# Patient Record
Sex: Male | Born: 1990 | Race: Black or African American | Hispanic: No | Marital: Married | Smoking: Former smoker
Health system: Southern US, Community
[De-identification: ages and names within clinical notes are randomized; demographics above are authoritative.]

## PROBLEM LIST (undated history)

## (undated) DIAGNOSIS — Z91018 Allergy to other foods: Secondary | ICD-10-CM

## (undated) DIAGNOSIS — J309 Allergic rhinitis, unspecified: Secondary | ICD-10-CM

## (undated) DIAGNOSIS — J45909 Unspecified asthma, uncomplicated: Secondary | ICD-10-CM

## (undated) HISTORY — DX: Unspecified asthma, uncomplicated: J45.909

## (undated) HISTORY — DX: Allergy to other foods: Z91.018

## (undated) HISTORY — DX: Allergic rhinitis, unspecified: J30.9

---

## 2015-09-17 ENCOUNTER — Encounter: Payer: Self-pay | Admitting: Pediatrics

## 2015-09-17 ENCOUNTER — Ambulatory Visit (INDEPENDENT_AMBULATORY_CARE_PROVIDER_SITE_OTHER): Payer: TRICARE For Life (TFL) | Admitting: Pediatrics

## 2015-09-17 VITALS — BP 108/76 | HR 70 | Temp 98.2°F | Resp 16 | Ht 70.0 in | Wt 155.6 lb

## 2015-09-17 DIAGNOSIS — J301 Allergic rhinitis due to pollen: Secondary | ICD-10-CM | POA: Diagnosis not present

## 2015-09-17 DIAGNOSIS — T7800XA Anaphylactic reaction due to unspecified food, initial encounter: Secondary | ICD-10-CM | POA: Insufficient documentation

## 2015-09-17 DIAGNOSIS — J4550 Severe persistent asthma, uncomplicated: Secondary | ICD-10-CM | POA: Diagnosis not present

## 2015-09-17 MED ORDER — EPINEPHRINE 0.3 MG/0.3ML IJ SOAJ: INTRAMUSCULAR | Status: AC

## 2015-09-17 MED ORDER — FLUTICASONE PROPIONATE 50 MCG/ACT NA SUSP: 2.0000 | Freq: Every day | NASAL | Status: AC

## 2015-09-17 MED ORDER — ALBUTEROL SULFATE HFA 108 (90 BASE) MCG/ACT IN AERS: 2.0000 | INHALATION_SPRAY | RESPIRATORY_TRACT | Status: AC | PRN

## 2015-09-17 MED ORDER — MOMETASONE FURO-FORMOTEROL FUM 200-5 MCG/ACT IN AERO: 2.0000 | INHALATION_SPRAY | Freq: Two times a day (BID) | RESPIRATORY_TRACT | Status: AC

## 2015-09-17 MED ORDER — MONTELUKAST SODIUM 10 MG PO TABS: 10.0000 mg | ORAL_TABLET | Freq: Every day | ORAL | Status: AC

## 2015-09-17 MED ORDER — CETIRIZINE HCL 10 MG PO TABS: 10.0000 mg | ORAL_TABLET | Freq: Every day | ORAL | Status: AC

## 2015-09-17 NOTE — Patient Instructions (Addendum)
Environmental control of dust mite and mold Zyrtec 10 mg once a day for runny nose or itchy eyes Fluticasone 2 sprays per nostril once a day for stuffy nose, if needed Opcon-A one drop 3 times a day if needed for itchy eyes Dulera 200-2 puffs every 12 hours for coughing or wheezing instead of Symbicort Montelukast  10 mg once a day for coughing or wheezing Pro-air 2 puffs every 4 hours if needed for wheezing or coughing spells Add prednisone 20 mg twice a day for 3 days, 20 mg on the fourth day, 10 mg on the fifth day to bring your asthma under control  Avoid tree nuts, corn and yeast. If you have an allergic reaction take Benadryl 50 mg every 4 hours and if you have life-threatening symptoms inject  with EpiPen 0.3 mg  Call me if you're not doing well on this treatment plan

## 2015-09-17 NOTE — Progress Notes (Signed)
[address] [place] [address] Dept: [phone]  New Patient Note  Patient ID: Kenneth Warner, male    DOB: [birth year]  Age: 25 y.o. MRN: [id number] Date of Office Visit: 09/17/2015 Referring provider: No referring provider defined for this encounter.    Chief Complaint: Asthma and Food Intolerance  HPI Kenneth Warner presents forFor evaluation of asthma for 5 years. He has been using his Pro-air inhaler twice a day for several years. He has had a runny nose and a stuffy nose for several years also. He has aggravation of his symptoms on exposure to dust, cigarette smoke and the spring and early summer. When he has eaten tree nuts, corn or yeast bread , he has had swelling of his face and swelling of his lips.. He does not have a history of eczema he has been on Symbicort 160-2 puffs every 12 hours for several years.. The last time he had an insect sting , he did not have any local swelling or anaphylactic symptoms. He took Benadryl and felt drowsy.  Review of Systems  Constitutional: Negative.   HENT:       Perennial nasal congestion for several years but worse in the springtime  Eyes: Negative.   Respiratory:       Asthma for 5 years  Cardiovascular: Negative.   Gastrointestinal: Negative.   Genitourinary: Negative.   Musculoskeletal: Negative.   Skin: Negative.   Neurological: Negative.   Endo/Heme/Allergies:       No diabetes or thyroid disease. Allergies to tree nuts, corn and yeast  Psychiatric/Behavioral: Negative.     Outpatient Encounter Prescriptions as of 09/17/2015  Medication Sig  . albuterol (PROAIR HFA) 108 (90 Base) MCG/ACT inhaler Inhale 2 puffs into the lungs every 4 (four) hours as needed for wheezing or shortness of breath.  . budesonide-formoterol (SYMBICORT) 160-4.5 MCG/ACT inhaler Inhale into the lungs 2 (two) times daily.  . cetirizine (ZYRTEC) 10 MG tablet Take 1 tablet (10 mg total) by mouth daily.  Marland Kitchen desloratadine (CLARINEX) 5 MG tablet Take  5 mg by mouth daily.  Marland Kitchen EPINEPHrine (EPIPEN 2-PAK) 0.3 mg/0.3 mL IJ SOAJ injection Use as directed for severe allergic reactions.  . fluticasone (FLONASE) 50 MCG/ACT nasal spray Place 2 sprays into both nostrils daily.  . mometasone-formoterol (DULERA) 200-5 MCG/ACT AERO Inhale 2 puffs into the lungs 2 (two) times daily.  . montelukast (SINGULAIR) 10 MG tablet Take 1 tablet (10 mg total) by mouth at bedtime.  . [DISCONTINUED] albuterol (PROAIR HFA) 108 (90 Base) MCG/ACT inhaler Inhale 2 puffs into the lungs every 4 (four) hours as needed for wheezing or shortness of breath.   No facility-administered encounter medications on file as of 09/17/2015.     Drug Allergies:  No Known Allergies  Family History: Jeury's family history includes Asthma in his mother; Eczema in his sister. There is no history of Allergic rhinitis, Angioedema, Immunodeficiency, or Urticaria..  Social and environmental. There is a dog outside the home. He is not exposed to cigarette smoking. He only smoked cigarettes for one half years he does not smoke cigarettes currently. He works indoors. His symptoms are not worse at work.  Physical Exam: BP 108/76 mmHg  Pulse 70  Temp(Src) 98.2 F (36.8 C) (Oral)  Resp 16  Ht  (1.778 m)  Wt 155 lb 9.6 oz (70.58 kg)  BMI 22.33 kg/m2  SpO2 98%   Physical Exam  Constitutional: He is oriented to person, place, and time. He appears well-developed and well-nourished.  HENT:  Eyes normal. Ears normal. Nose moderate swelling of nasal turbinates with clear nasal discharge. Pharynx normal.  Neck: Neck supple. No thyromegaly present.  Cardiovascular:  S1 and S2 normal no murmurs  Pulmonary/Chest:  Clear to percussion and auscultation  Abdominal: Soft. There is no tenderness (no hepatosplenomegaly).  Lymphadenopathy:    He has no cervical adenopathy.  Neurological: He is alert and oriented to person, place, and time.  Skin:  Clear  Psychiatric: He has a normal mood and  affect. His behavior is normal. Judgment and thought content normal.  Vitals reviewed.   Diagnostics: FVC 4.58 L FEV1 3.56 L. Predicted FVC 4.65 L predicted 13.92 liters. After albuterol 2 puffs FVC 4.92 L FEV1 4.08 L-this shows a mild reduction in the FEV1 percent with significant improvement in the FEV1 after albuterol  Allergy skin tests were positive to grass pollens, weeds, tree pollens, molds, dust mite, cat and cockroach. He also had positive skin test to corn, pecan, walnut, almond, hazelnut and yeast.   Assessment Assessment and Plan: 1. Severe persistent asthma, uncomplicated   2. Allergic rhinitis due to pollen   3. Allergy with anaphylaxis due to food, initial encounter     Meds ordered this encounter  Medications  . mometasone-formoterol (DULERA) 200-5 MCG/ACT AERO    Sig: Inhale 2 puffs into the lungs 2 (two) times daily.    Dispense:  1 Inhaler    Refill:  5    To prevent cough or wheeze.  Marland Kitchen. albuterol (PROAIR HFA) 108 (90 Base) MCG/ACT inhaler    Sig: Inhale 2 puffs into the lungs every 4 (four) hours as needed for wheezing or shortness of breath.    Dispense:  1 Inhaler    Refill:  1  . fluticasone (FLONASE) 50 MCG/ACT nasal spray    Sig: Place 2 sprays into both nostrils daily.    Dispense:  16 g    Refill:  5    For stuffy nose.  . montelukast (SINGULAIR) 10 MG tablet    Sig: Take 1 tablet (10 mg total) by mouth at bedtime.    Dispense:  30 tablet    Refill:  5    For cough or wheeze.  Marland Kitchen. EPINEPHrine (EPIPEN 2-PAK) 0.3 mg/0.3 mL IJ SOAJ injection    Sig: Use as directed for severe allergic reactions.    Dispense:  2 Device    Refill:  1    Dispense mylan brand only.  . cetirizine (ZYRTEC) 10 MG tablet    Sig: Take 1 tablet (10 mg total) by mouth daily.    Dispense:  34 tablet    Refill:  5    For runny nose or itching.    Patient Instructions  Environmental control of dust mite and mold Zyrtec 10 mg once a day for runny nose or itchy  eyes Fluticasone 2 sprays per nostril once a day for stuffy nose Opcon-A one drop 3 times a day if needed for itchy eyes Dulera 200-2 puffs every 12 hours for coughing or wheezing instead of Symbicort Montelukast  10 mg once a day for coughing or wheezing Pro-air 2 puffs every 4 hours if needed for wheezing or coughing spells Add prednisone 20 mg twice a day for 3 days, 20 mg on the fourth day, 10 mg on the fifth day to bring your asthma under control  Avoid tree nuts, corn and yeast. If you have an allergic reaction take Benadryl 50 mg every 4 hours and if you have  life-threatening symptoms inject  with EpiPen 0.3 mg  Call me if you're not doing well on this treatment plan    Return in about 4 weeks (around 10/15/2015).   Thank you for the opportunity to care for this patient.  Please do not hesitate to contact me with questions.  Tonette BihariJ. A. Hamish Banks, M.D.  [facility] [place] [place] [address] [place], [place] [address] [phone]

## 2015-10-19 ENCOUNTER — Ambulatory Visit: Payer: TRICARE For Life (TFL) | Admitting: Pediatrics

## 2016-03-14 ENCOUNTER — Ambulatory Visit (HOSPITAL_BASED_OUTPATIENT_CLINIC_OR_DEPARTMENT_OTHER)
Admission: RE | Admit: 2016-03-14 | Discharge: 2016-03-14 | Disposition: A | Discharge status: Home / Self Care | Payer: Self-pay | Source: Ambulatory Visit | Attending: Internal Medicine | Admitting: Internal Medicine

## 2016-03-14 ENCOUNTER — Other Ambulatory Visit (HOSPITAL_BASED_OUTPATIENT_CLINIC_OR_DEPARTMENT_OTHER): Payer: Self-pay | Admitting: Internal Medicine

## 2016-03-14 DIAGNOSIS — M79672 Pain in left foot: Secondary | ICD-10-CM

## 2016-03-14 DIAGNOSIS — M79673 Pain in unspecified foot: Secondary | ICD-10-CM | POA: Insufficient documentation

## 2016-08-30 ENCOUNTER — Other Ambulatory Visit: Payer: Self-pay | Admitting: Allergy

## 2016-08-31 ENCOUNTER — Telehealth: Payer: Self-pay | Admitting: Allergy

## 2016-08-31 ENCOUNTER — Other Ambulatory Visit: Payer: Self-pay | Admitting: Allergy

## 2016-08-31 NOTE — Telephone Encounter (Signed)
[facility] Left message for patient to call office. He needs to make appointment.Faxed [facility] to [facility].

## 2017-11-11 IMAGING — DX DG FOOT COMPLETE 3+V*L*
3 series · 3 of 3 positions shown · non-contrast
Comparison: None.

CLINICAL DATA: 25 y/o M; pain over the tarsals 4 months and soft
tissue swelling.

EXAM:
LEFT FOOT - COMPLETE 3+ VIEW

[foot ap]
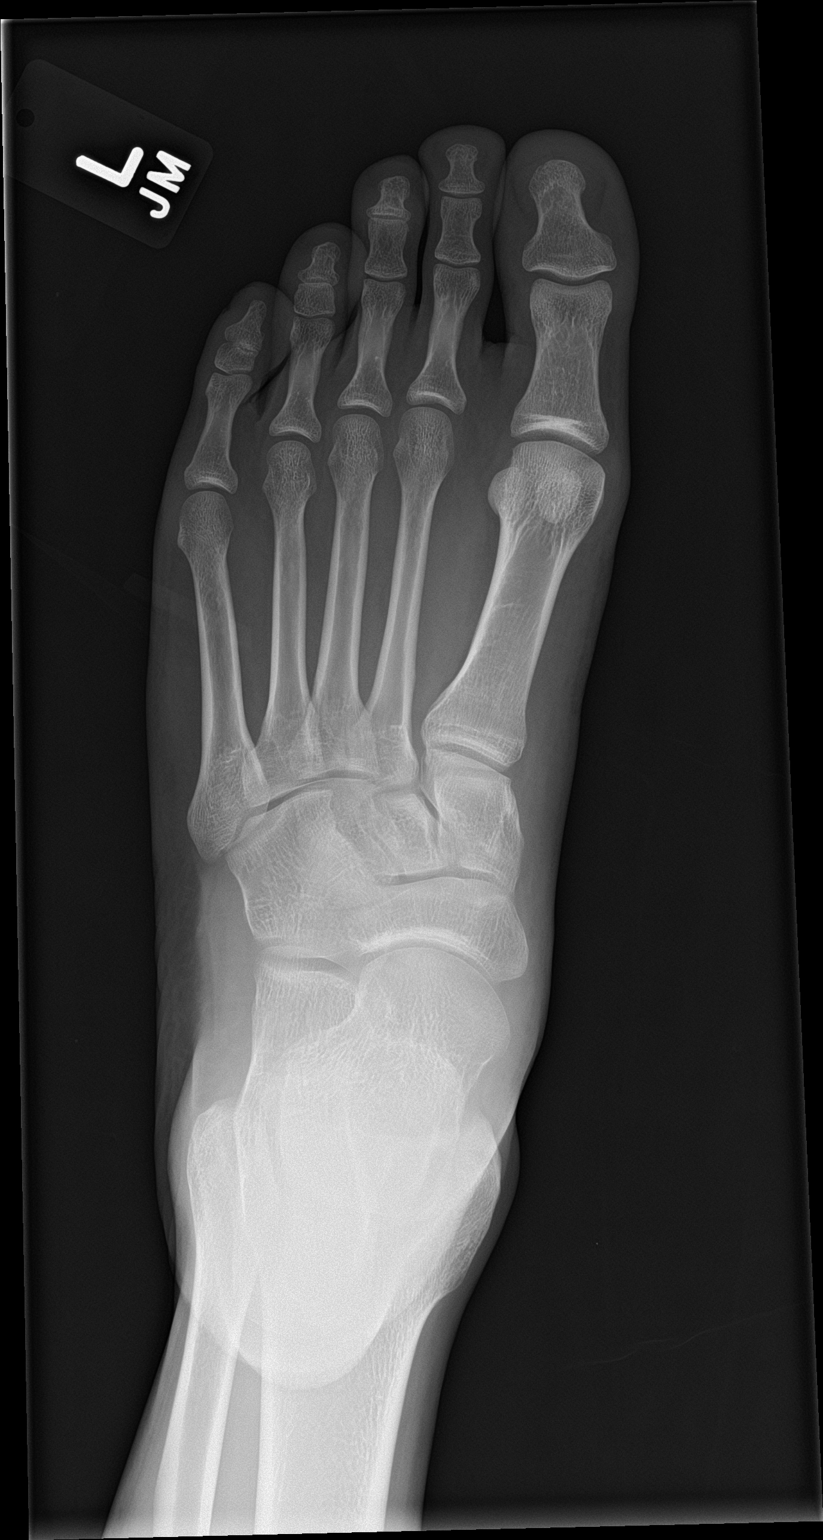

[foot obl]
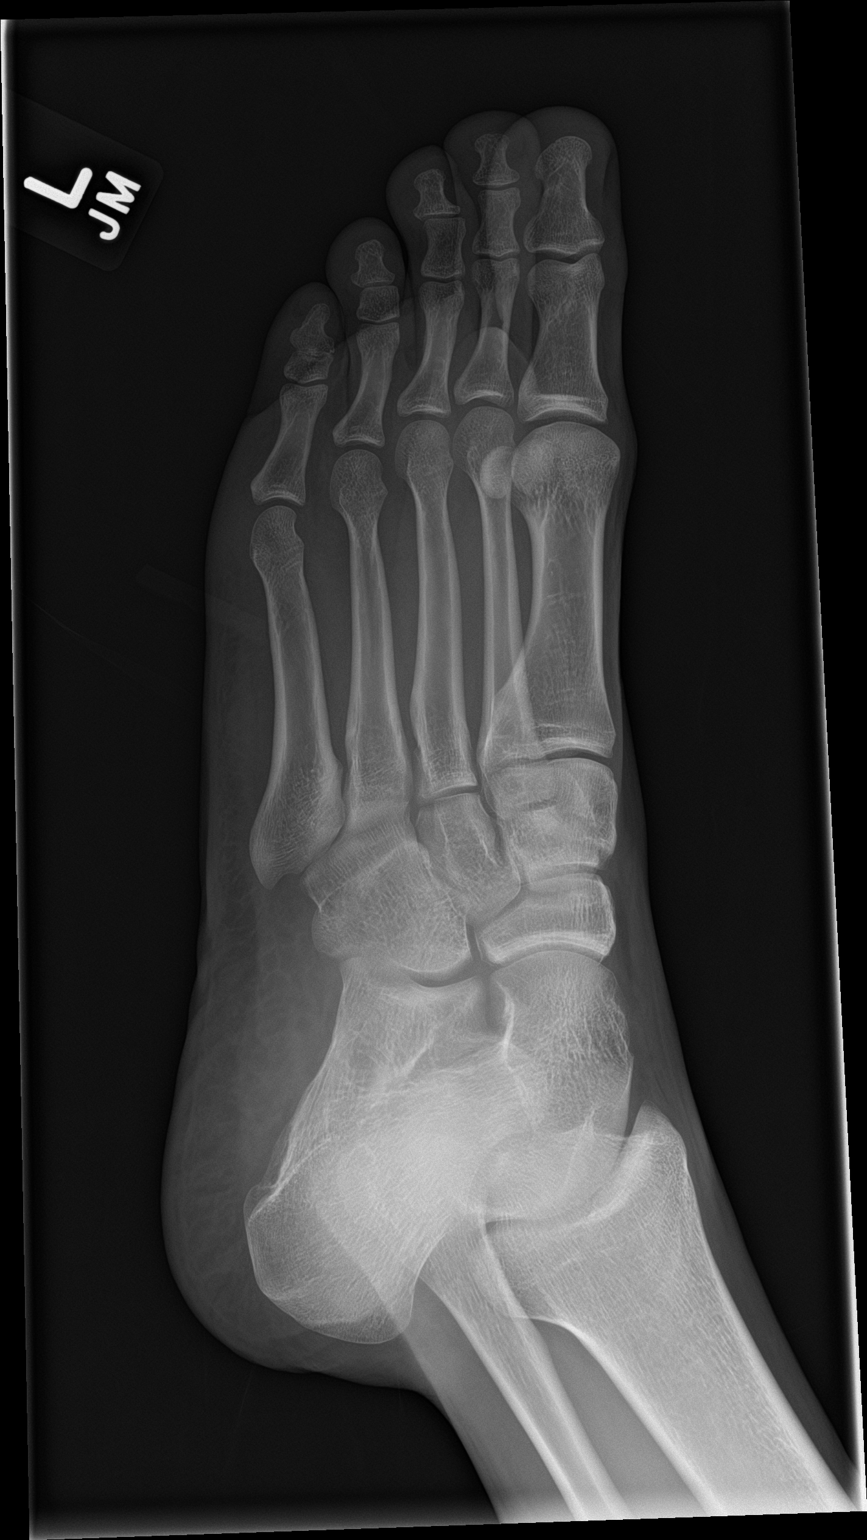

[foot lat]
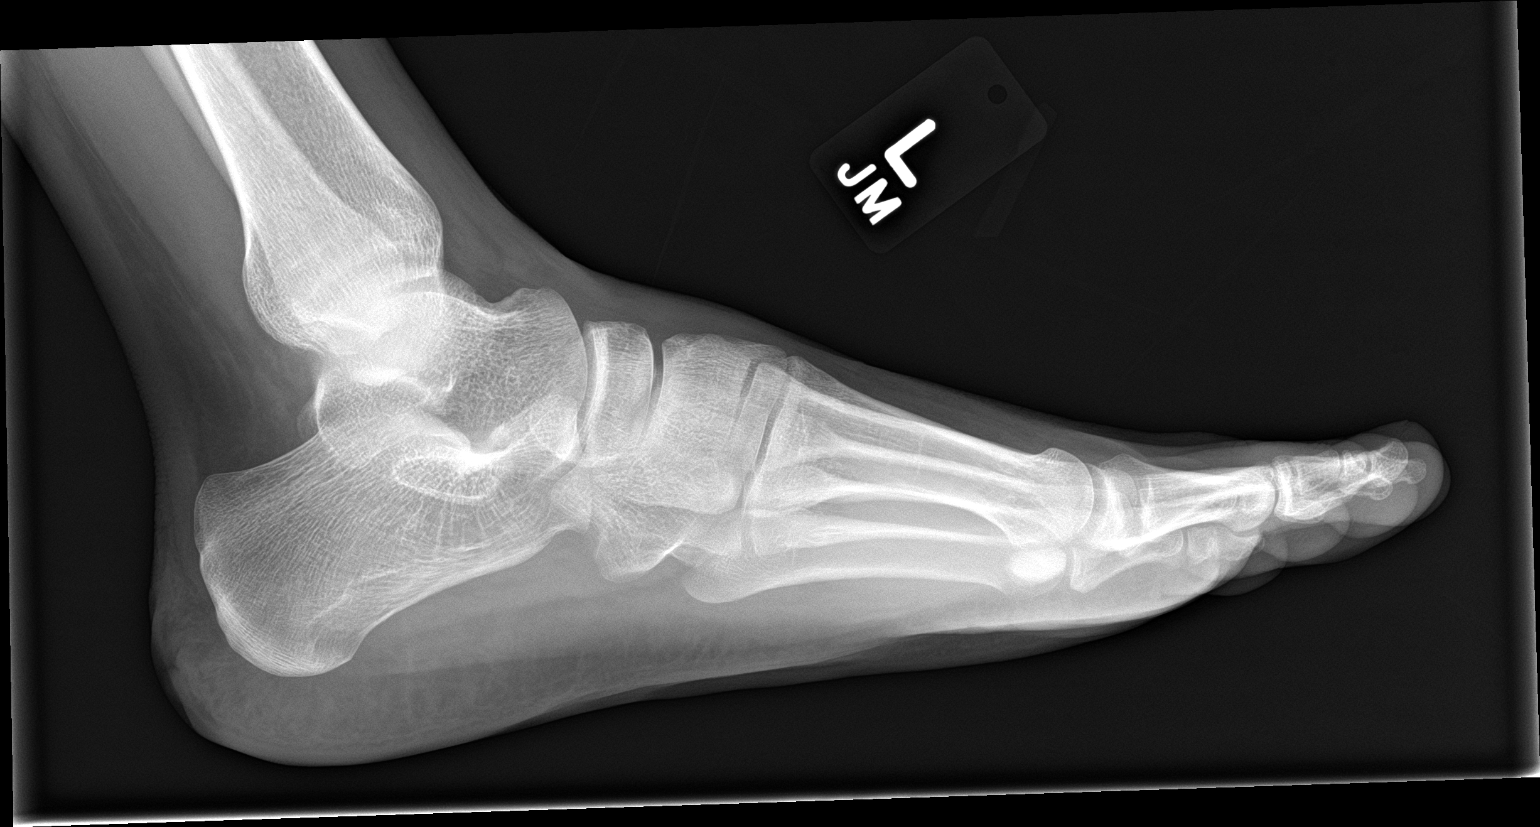

[3 of 3 positions shown; findings below may reference images not displayed]

FINDINGS: There is no evidence of fracture or dislocation. There is no
evidence of arthropathy or other focal bone abnormality. Soft
tissues are unremarkable.
IMPRESSION: Negative.

By: Ristel Yong M.D.

## 2019-12-05 ENCOUNTER — Inpatient Hospital Stay: Payer: Self-pay | Attending: General Practice

## 2019-12-05 ENCOUNTER — Other Ambulatory Visit: Payer: Self-pay

## 2019-12-05 DIAGNOSIS — Z23 Encounter for immunization: Secondary | ICD-10-CM

## 2019-12-06 NOTE — Progress Notes (Signed)
Late entry    Covid-19 Vaccination Clinic  Name:  Kenneth Warner    MRN: [id number] DOB: 04/17/1990  12/06/2019  Mr. Kenneth Warner was observed post Covid-19 immunization for 15 minutes without incident. He was provided with Vaccine Information Sheet and instruction to access the V-Safe system.   Mr. Kenneth Warner was instructed to call 911 with any severe reactions post vaccine: Marland Kitchen Difficulty breathing  . Swelling of face and throat  . A fast heartbeat  . A bad rash all over body  . Dizziness and weakness

## 2019-12-26 ENCOUNTER — Inpatient Hospital Stay: Payer: Self-pay

## 2019-12-26 ENCOUNTER — Other Ambulatory Visit: Payer: Self-pay

## 2019-12-26 DIAGNOSIS — Z23 Encounter for immunization: Secondary | ICD-10-CM

## 2019-12-26 NOTE — Patient Instructions (Signed)
   Covid-19 Vaccination Clinic  Name:  Javanni Maring    MRN: [id number] DOB: 1990/05/14  12/26/2019  Mr. Knaggs was observed post Covid-19 immunization for 15 minutes without incident. He was provided with Vaccine Information Sheet and instruction to access the V-Safe system.   Mr. Corniel was instructed to call 911 with any severe reactions post vaccine: Marland Kitchen Difficulty breathing  . Swelling of face and throat  . A fast heartbeat  . A bad rash all over body  . Dizziness and weakness   @[email or web address] @
